# Patient Record
Sex: Male | Born: 2000 | Race: White | Hispanic: No | Marital: Single | Smoking: Never smoker
Health system: Southern US, Community
[De-identification: ages and names within clinical notes are randomized; demographics above are authoritative.]

## PROBLEM LIST (undated history)

## (undated) HISTORY — PX: OTHER SURGICAL HISTORY: SHX169

## (undated) HISTORY — PX: HERNIA REPAIR: SHX51

---
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
## 2022-03-08 ENCOUNTER — Inpatient Hospital Stay
Admission: RE | Admit: 2022-03-08 | Discharge: 2022-03-08 | Disposition: A | Discharge status: Home / Self Care | Payer: Managed Care, Other (non HMO) | Source: Ambulatory Visit

## 2022-03-08 ENCOUNTER — Ambulatory Visit
Admission: EM | Admit: 2022-03-08 | Discharge: 2022-03-08 | Disposition: A | Discharge status: Home / Self Care | Payer: Managed Care, Other (non HMO) | Attending: Emergency Medicine | Admitting: Emergency Medicine

## 2022-03-08 DIAGNOSIS — J302 Other seasonal allergic rhinitis: Secondary | ICD-10-CM | POA: Diagnosis not present

## 2022-03-08 DIAGNOSIS — R059 Cough, unspecified: Secondary | ICD-10-CM

## 2022-03-08 MED ORDER — FLUTICASONE PROPIONATE 50 MCG/ACT NA SUSP: 1.0000 | Freq: Every day | NASAL | 0 refills | Status: DC

## 2022-03-08 MED ORDER — CETIRIZINE HCL 10 MG PO TABS: 10.0000 mg | ORAL_TABLET | Freq: Every day | ORAL | 0 refills | Status: DC

## 2022-03-08 NOTE — ED Provider Notes (Signed)
William Barr    CSN: [id number] Arrival date & time: 03/08/22  1151      History   Chief Complaint Chief Complaint  Patient presents with   Cough    Neck and lung pain - Entered by patient    HPI William Barr is a 21 y.o. male.  Accompanied by his mother, patient presents with cough, congestion, runny nose x3 weeks.  No fever, chills, shortness of breath, vomiting, diarrhea, or other symptoms.  No OTC medications taken today; previously treated with DayQuil.  No pertinent medical history.  The history is provided by the patient and a parent.    History reviewed. No pertinent past medical history.  There are no problems to display for this patient.   Past Surgical History:  Procedure Laterality Date   HERNIA REPAIR     testicular torsion         Home Medications    Prior to Admission medications   Medication Sig Start Date End Date Taking? Authorizing Provider  cetirizine (ZYRTEC ALLERGY) 10 MG tablet Take 1 tablet (10 mg total) by mouth daily. 03/08/22  Yes Mickie Bail, NP  fluticasone (FLONASE) 50 MCG/ACT nasal spray Place 1 spray into both nostrils daily. 03/08/22  Yes Mickie Bail, NP  doxycycline (MONODOX) 100 MG capsule Take 1 capsule (100 mg total) by mouth as directed. 1 po bid for 1 week, then decrease to 1 po qd for 3 weeks, take with food and drink 08/05/21   Deirdre Evener, MD    Family History History reviewed. No pertinent family history.  Social History Social History   Tobacco Use   Smoking status: Never   Smokeless tobacco: Never  Substance Use Topics   Alcohol use: Yes   Drug use: Never     Allergies   Patient has no known allergies.   Review of Systems Review of Systems  Constitutional:  Negative for chills and fever.  HENT:  Positive for congestion, postnasal drip and rhinorrhea. Negative for ear pain and sore throat.   Respiratory:  Positive for cough. Negative for shortness of breath.   Cardiovascular:  Negative for  chest pain and palpitations.  Gastrointestinal:  Negative for diarrhea and vomiting.  Skin:  Negative for color change and rash.  All other systems reviewed and are negative.    Physical Exam Triage Vital Signs ED Triage Vitals [03/08/22 1154]  Enc Vitals Group     BP 115/71     Pulse Rate 62     Resp 18     Temp 98.4 F (36.9 C)     Temp src      SpO2 97 %     Weight      Height      Head Circumference      Peak Flow      Pain Score      Pain Loc      Pain Edu?      Excl. in GC?    No data found.  Updated Vital Signs BP 115/71   Pulse 62   Temp 98.4 F (36.9 C)   Resp 18   Ht 5\' 9"  (1.753 m)   Wt 145 lb (65.8 kg)   SpO2 97%   BMI 21.41 kg/m   Visual Acuity Right Eye Distance:   Left Eye Distance:   Bilateral Distance:    Right Eye Near:   Left Eye Near:    Bilateral Near:     Physical Exam  Vitals and nursing note reviewed.  Constitutional:      General: He is not in acute distress.    Appearance: Normal appearance. He is well-developed. He is not ill-appearing.  HENT:     Right Ear: Tympanic membrane normal.     Left Ear: Tympanic membrane normal.     Nose: Nose normal.     Mouth/Throat:     Mouth: Mucous membranes are moist.     Pharynx: Oropharynx is clear.     Comments: Clear PND. Cardiovascular:     Rate and Rhythm: Normal rate and regular rhythm.     Heart sounds: Normal heart sounds.  Pulmonary:     Effort: Pulmonary effort is normal. No respiratory distress.     Breath sounds: Normal breath sounds.  Musculoskeletal:     Cervical back: Neck supple.  Skin:    General: Skin is warm and dry.  Neurological:     Mental Status: He is alert.  Psychiatric:        Mood and Affect: Mood normal.        Behavior: Behavior normal.      UC Treatments / Results  Labs (all labs ordered are listed, but only abnormal results are displayed) Labs Reviewed - No data to display  EKG   Radiology No results found.  Procedures Procedures  (including critical care time)  Medications Ordered in UC Medications - No data to display  Initial Impression / Assessment and Plan / UC Course  I have reviewed the triage vital signs and the nursing notes.  Pertinent labs & imaging results that were available during my care of the patient were reviewed by me and considered in my medical decision making (see chart for details).   Seasonal allergies, cough.  Afebrile, vital signs are stable.  Treating with Zyrtec and Flonase.  Education provided on allergic rhinitis and cough.  Instructed patient to follow up with his PCP if his symptoms are not improving.  He agrees to plan of care.     Final Clinical Impressions(s) / UC Diagnoses   Final diagnoses:  Seasonal allergies  Cough, unspecified type     Discharge Instructions      Take the Zyrtec and use the Flonase nasal spray as directed.  Follow up with your primary care provider if your symptoms are not improving.        ED Prescriptions     Medication Sig Dispense Auth. Provider   cetirizine (ZYRTEC ALLERGY) 10 MG tablet Take 1 tablet (10 mg total) by mouth daily. 30 tablet Sharion Balloon, NP   fluticasone (FLONASE) 50 MCG/ACT nasal spray Place 1 spray into both nostrils daily. 16 g Sharion Balloon, NP      PDMP not reviewed this encounter.   Sharion Balloon, NP 03/08/22 1255

## 2022-03-08 NOTE — Discharge Instructions (Addendum)
Take the Zyrtec and use the Flonase nasal spray as directed.  Follow up with your primary care provider if your symptoms are not improving.

## 2022-03-08 NOTE — ED Triage Notes (Addendum)
Patient to Urgent Care with complaints of cough, productive at times with thick white mucus. Symptoms started 3 weeks ago. 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   Has been using a air filter in his dorm room with a hepa filter without improvement in symptoms.

## 2022-04-12 ENCOUNTER — Ambulatory Visit
Admission: EM | Admit: 2022-04-12 | Discharge: 2022-04-12 | Disposition: A | Discharge status: Home / Self Care | Payer: Managed Care, Other (non HMO)

## 2022-04-12 DIAGNOSIS — J029 Acute pharyngitis, unspecified: Secondary | ICD-10-CM | POA: Diagnosis not present

## 2022-04-12 LAB — POCT RAPID STREP A (OFFICE): Rapid Strep A Screen: NEGATIVE

## 2022-04-12 MED ORDER — AZITHROMYCIN 250 MG PO TABS: ORAL_TABLET | ORAL | 0 refills | Status: DC

## 2022-04-12 NOTE — Discharge Instructions (Signed)
I have prescribed an antibiotic for you to use if your symptoms worsen in the next few days.  Follow-up with your primary care provider or here if symptoms do not resolve or worsen even with treatment.

## 2022-04-12 NOTE — ED Provider Notes (Signed)
William Barr    CSN: [id number] Arrival date & time: 04/12/22  1807      History   Chief Complaint Chief Complaint  Patient presents with   Sore Throat    Entered by patient    HPI William Barr is a 21 y.o. male.    Sore Throat    Companied by his mom.  Presents to UC with complaint of sore throat and swollen lymph nodes starting this morning.  Pain is worse in the morning and improves after drinking cold liquids.  Pain recurs throughout the day.  Denies fever, myalgias, chills.  Endorses communal living in dorm at college.  Recent contact with family members with similar symptoms and diagnosed with strep.  History reviewed. No pertinent past medical history.  There are no problems to display for this patient.   Past Surgical History:  Procedure Laterality Date   HERNIA REPAIR     testicular torsion         Home Medications    Prior to Admission medications   Medication Sig Start Date End Date Taking? Authorizing Provider  EPINEPHrine 0.3 mg/0.3 mL IJ SOAJ injection Inject into the muscle. 11/09/21  Yes [provider]  cetirizine (ZYRTEC ALLERGY) 10 MG tablet Take 1 tablet (10 mg total) by mouth daily. 03/08/22   Mickie Bail, NP  doxycycline (MONODOX) 100 MG capsule Take 1 capsule (100 mg total) by mouth as directed. 1 po bid for 1 week, then decrease to 1 po qd for 3 weeks, take with food and drink 08/05/21   Deirdre Evener, MD  fluticasone [facility]) 50 MCG/ACT nasal spray Place 1 spray into both nostrils daily. 03/08/22   Mickie Bail, NP    Family History History reviewed. No pertinent family history.  Social History Social History   Tobacco Use   Smoking status: Never   Smokeless tobacco: Never  Substance Use Topics   Alcohol use: Yes   Drug use: Never     Allergies   Patient has no known allergies.   Review of Systems Review of Systems   Physical Exam Triage Vital Signs ED Triage Vitals  Enc Vitals Group     BP  04/12/22 1908 108/60     Pulse Rate 04/12/22 1908 (!) 106     Resp 04/12/22 1908 18     Temp 04/12/22 1908 98.7 F (37.1 C)     Temp Source 04/12/22 1908 Oral     SpO2 04/12/22 1908 97 %     Weight --      Height --      Head Circumference --      Peak Flow --      Pain Score 04/12/22 1916 5     Pain Loc --      Pain Edu? --      Excl. in GC? --    No data found.  Updated Vital Signs BP 108/60 (BP Location: Left Arm)   Pulse (!) 106   Temp 98.7 F (37.1 C) (Oral)   Resp 18   SpO2 97%   Visual Acuity Right Eye Distance:   Left Eye Distance:   Bilateral Distance:    Right Eye Near:   Left Eye Near:    Bilateral Near:     Physical Exam Vitals reviewed.  Constitutional:      Appearance: He is well-developed.  HENT:     Mouth/Throat:     Mouth: Mucous membranes are moist.  Pharynx: Posterior oropharyngeal erythema present. No oropharyngeal exudate.     Tonsils: No tonsillar exudate.  Skin:    General: Skin is warm and dry.  Neurological:     General: No focal deficit present.     Mental Status: He is alert and oriented to person, place, and time.  Psychiatric:        Mood and Affect: Mood normal.        Behavior: Behavior normal.      UC Treatments / Results  Labs (all labs ordered are listed, but only abnormal results are displayed) Labs Reviewed  POCT RAPID STREP A (OFFICE)    EKG   Radiology No results found.  Procedures Procedures (including critical care time)  Medications Ordered in UC Medications - No data to display  Initial Impression / Assessment and Plan / UC Course  I have reviewed the triage vital signs and the nursing notes.  Pertinent labs & imaging results that were available during my care of the patient were reviewed by me and considered in my medical decision making (see chart for details).   Rapid strep is negative.  Pharynx is erythematous without presence of peritonsillar exudates.  Patient is college student and no  access to transportation if symptoms worsen.  Mom requests prescription for antibiotic for patient to use if symptoms worsen or develops peritonsillar exudates.   Final Clinical Impressions(s) / UC Diagnoses   Final diagnoses:  None   Discharge Instructions   None    ED Prescriptions   None    PDMP not reviewed this encounter.   Rose Phi, [place] 04/12/22 1930

## 2022-04-12 NOTE — ED Triage Notes (Signed)
Pt. Presents to UC w/ c/o a sore throat and swollen lymph nodes that started this morning.

## 2022-04-15 ENCOUNTER — Ambulatory Visit
Admission: RE | Admit: 2022-04-15 | Discharge: 2022-04-15 | Disposition: A | Discharge status: Home / Self Care | Payer: Managed Care, Other (non HMO) | Source: Ambulatory Visit | Attending: Emergency Medicine | Admitting: Emergency Medicine

## 2022-04-15 VITALS — BP 101/69 | HR 82 | Temp 98.4°F | Resp 16

## 2022-04-15 DIAGNOSIS — J029 Acute pharyngitis, unspecified: Secondary | ICD-10-CM | POA: Insufficient documentation

## 2022-04-15 DIAGNOSIS — R6889 Other general symptoms and signs: Secondary | ICD-10-CM | POA: Diagnosis not present

## 2022-04-15 DIAGNOSIS — Z1152 Encounter for screening for COVID-19: Secondary | ICD-10-CM | POA: Insufficient documentation

## 2022-04-15 LAB — RESP PANEL BY RT-PCR (RSV, FLU A&B, COVID)  RVPGX2
Influenza A by PCR: NEGATIVE
Influenza B by PCR: NEGATIVE
Resp Syncytial Virus by PCR: NEGATIVE
SARS Coronavirus 2 by RT PCR: NEGATIVE

## 2022-04-15 LAB — POCT MONO SCREEN (KUC): Mono, POC: NEGATIVE

## 2022-04-15 MED ORDER — IBUPROFEN 600 MG PO TABS: 600.0000 mg | ORAL_TABLET | Freq: Four times a day (QID) | ORAL | 0 refills | Status: DC | PRN

## 2022-04-15 MED ORDER — AMOXICILLIN-POT CLAVULANATE 875-125 MG PO TABS: 1.0000 | ORAL_TABLET | Freq: Two times a day (BID) | ORAL | 0 refills | Status: AC

## 2022-04-15 NOTE — ED Triage Notes (Signed)
Pt. Present to UC w/ c/o a sore throat, bodyaches, fatigue and a headache that started 5 days ago. Pt. Was seen and treated her on 11/06 for similar symptoms that have gotten progressively worse.

## 2022-04-15 NOTE — ED Provider Notes (Signed)
HPI  SUBJECTIVE:  William Barr is a 21 y.o. male who presents with persistent sore throat, fevers Tmax 102, body aches, headaches, nasal congestion, rhinorrhea, postnasal drip, nausea, swollen cervical lymphadenopathy.  He is on day #5/5 of azithromycin, has been taking Advil, NyQuil, Mucinex.  The Advil helps with the headache.  No alleviating factors.  No aggravating factors.  No sinus pain or pressure, facial swelling, upper dental pain, cough, wheeze, shortness of breath, vomiting, diarrhea, abdominal pain.  No rash, drooling, trismus, sensation of throat swelling shut, neck stiffness, voice changes.   no known mono, COVID, flu exposure.  He got 2 doses of the COVID-vaccine.  He did not get this years flu vaccine.  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  No antipyretic in the past 6 hours.  He had a negative home COVID test last night.  He had mono 2 years ago.  All immunizations are up-to-date.  PCP: [facility]  Patient was seen here on 11/7, 3 days ago, with sore throat and swollen lymph nodes.  Rapid strep negative.  Patient was sent home with a wait-and-see prescription of azithromycin, which he states he started that day.  History reviewed. No pertinent past medical history.  Past Surgical History:  Procedure Laterality Date   HERNIA REPAIR     testicular torsion      History reviewed. No pertinent family history.  Social History   Tobacco Use   Smoking status: Never   Smokeless tobacco: Never  Substance Use Topics   Alcohol use: Yes   Drug use: Never    No current facility-administered medications for this encounter.  Current Outpatient Medications:    amoxicillin-clavulanate (AUGMENTIN) 875-125 MG tablet, Take 1 tablet by mouth every 12 (twelve) hours for 10 days., Disp: 20 tablet, Rfl: 0   ibuprofen (ADVIL) 600 MG tablet, Take 1 tablet (600 mg total) by mouth every 6 (six) hours as needed., Disp: 30 tablet, Rfl: 0    EPINEPHrine 0.3 mg/0.3 mL IJ SOAJ injection, Inject into the muscle., Disp: , Rfl:    fluticasone (FLONASE) 50 MCG/ACT nasal spray, Place 1 spray into both nostrils daily., Disp: 16 g, Rfl: 0  No Known Allergies   ROS  As noted in HPI.   Physical Exam  BP 101/69   Pulse 82   Temp 98.4 F (36.9 C)   Resp 16   SpO2 96%   Constitutional: Well developed, well nourished, no acute distress Eyes:  EOMI, conjunctiva normal bilaterally HENT: Normocephalic, atraumatic,mucus membranes moist.  Positive nasal congestion.  Erythematous, swollen turbinates.  No maxillary, frontal sinus tenderness.  Erythematous oropharynx, tonsils slightly enlarged, no exudates.  Uvula midline. Neck: Positive anterior, posterior lymph nodes. Respiratory: Normal inspiratory effort Cardiovascular: Normal rate regular rhythm, no murmurs GI: nondistended, nontender, no splenomegaly skin: No rash, skin intact Musculoskeletal: no deformities Neurologic: Alert & oriented x 3, no focal neuro deficits Psychiatric: Speech and behavior appropriate   ED Course   Medications - No data to display  Orders Placed This Encounter  Procedures   Resp panel by RT-PCR (RSV, Flu A&B, Covid) Anterior Nasal Swab    Standing Status:   Standing    Number of Occurrences:   1    Order Specific Question:   Patient immune status    Answer:   Normal    Order Specific Question:   Release to patient    Answer:   Immediate [1]   POCT mono screen  Standing Status:   Standing    Number of Occurrences:   1    Results for orders placed or performed during the hospital encounter of 04/15/22 (from the past 24 hour(s))  POCT mono screen     Status: None   Collection Time: 04/15/22  9:04 AM  Result Value Ref Range   Mono, POC Negative Negative   No results found.  ED Clinical Impression  1. Acute pharyngitis, unspecified etiology   2. Flu-like symptoms      ED Assessment/Plan     Previous records reviewed.  As noted in  HPI. Patient with an acute illness with systemic symptoms of fever. Patient with anterior/posterior lymphadenopathy, continued pharyngitis and fevers despite being on azithromycin day 5/5.  will check a mono, COVID and flu even though patient is out of the treatment window for both.  Doubt RPA, peritonsillar abscess, epiglottitis with a normal voice.  Monospot negative.  COVID, flu pending.   discussed with mother and with patient that unfortunately he is out of the treatment window for both.  Home with Flonase, Benadryl/Maalox mixture, Tylenol/ibuprofen.  We can try some Augmentin for pharyngitis.  ER return precautions given.  Discussed labs, MDM, treatment plan, and plan for follow-up with parent and patient. Discussed sn/sx that should prompt return to the ED. they agree with plan.   Meds ordered this encounter  Medications   amoxicillin-clavulanate (AUGMENTIN) 875-125 MG tablet    Sig: Take 1 tablet by mouth every 12 (twelve) hours for 10 days.    Dispense:  20 tablet    Refill:  0   ibuprofen (ADVIL) 600 MG tablet    Sig: Take 1 tablet (600 mg total) by mouth every 6 (six) hours as needed.    Dispense:  30 tablet    Refill:  0      *This clinic note was created using Scientist, clinical (histocompatibility and immunogenetics). Therefore, there may be occasional mistakes despite careful proofreading.  ?    Domenick Gong, MD 04/15/22 [phone]

## 2022-04-15 NOTE — Discharge Instructions (Signed)
Mono negative.  Actually, the treatment for pharyngitis is 10, rather than 7 days.  However, this will still cover sinusitis.  The antibiotics, even if you feel better.  Flonase, saline nasal irrigation with a Lloyd Huger Med rinse and distilled water as often as you want, take 600 mg of ibuprofen combined with 1000 mg of Tylenol 3 times a day as needed. Make sure you drink plenty of extra fluids.  Some people find salt water gargles and  Traditional Medicinal's "Throat Coat" tea helpful. Take 5 mL of liquid Benadryl and 5 mL of Maalox. Mix it together, and then hold it in your mouth for as long as you can and then swallow. You may do this 4 times a day.    Go to www.goodrx.com  or www.costplusdrugs.com to look up your medications. This will give you a list of where you can find your prescriptions at the most affordable prices. Or ask the pharmacist what the cash price is, or if they have any other discount programs available to help make your medication more affordable. This can be less expensive than what you would pay with insurance.

## 2022-08-16 ENCOUNTER — Telehealth: Payer: Self-pay

## 2022-08-16 NOTE — Telephone Encounter (Signed)
Copied from [place] [phone]. Topic: Appointment Scheduling - New Patient >> Aug 15, 2022  4:33 PM William Barr wrote: Reason for CRM: New Patient  New patient has not been scheduled for your office. Patients father is an established patient and wants his son to see Dr Garnette Gunner also.  Patient is on Spring Break, due to fathers HCM diagnosis he is concerned and would like to know if provider could see his son this week.

## 2022-08-16 NOTE — Telephone Encounter (Signed)
Apt 08/17/2022

## 2022-08-16 NOTE — Telephone Encounter (Signed)
Yeah that's fine, any 40 min opening will work

## 2022-08-17 ENCOUNTER — Encounter: Payer: Self-pay | Admitting: Internal Medicine

## 2022-08-17 ENCOUNTER — Ambulatory Visit: Payer: Managed Care, Other (non HMO) | Admitting: Internal Medicine

## 2022-08-17 VITALS — BP 96/84 | HR 70 | Temp 96.9°F | Ht 69.0 in | Wt 137.0 lb

## 2022-08-17 DIAGNOSIS — Z0001 Encounter for general adult medical examination with abnormal findings: Secondary | ICD-10-CM

## 2022-08-17 DIAGNOSIS — F988 Other specified behavioral and emotional disorders with onset usually occurring in childhood and adolescence: Secondary | ICD-10-CM | POA: Diagnosis not present

## 2022-08-17 DIAGNOSIS — Z113 Encounter for screening for infections with a predominantly sexual mode of transmission: Secondary | ICD-10-CM | POA: Diagnosis not present

## 2022-08-17 DIAGNOSIS — Z8249 Family history of ischemic heart disease and other diseases of the circulatory system: Secondary | ICD-10-CM

## 2022-08-17 NOTE — Patient Instructions (Signed)
Preventive Care 18-21 Years Old, Male ?Preventive care refers to lifestyle choices and visits with your health care provider that can promote health and wellness. At this stage in your life, you may start seeing a primary care physician instead of a pediatrician for your preventive care. Preventive care visits are also called wellness exams. ?What can I expect for my preventive care visit? ?Counseling ?During your preventive care visit, your health care provider may ask about your: ?Medical history, including: ?Past medical problems. ?Family medical history. ?Current health, including: ?Home life and relationship well-being. ?Emotional well-being. ?Sexual activity and sexual health. ?Lifestyle, including: ?Alcohol, nicotine or tobacco, and drug use. ?Access to firearms. ?Diet, exercise, and sleep habits. ?Sunscreen use. ?Motor vehicle safety. ?Physical exam ?Your health care provider may check your: ?Height and weight. These may be used to calculate your BMI (body mass index). BMI is a measurement that tells if you are at a healthy weight. ?Waist circumference. This measures the distance around your waistline. This measurement also tells if you are at a healthy weight and may help predict your risk of certain diseases, such as type 2 diabetes and high blood pressure. ?Heart rate and blood pressure. ?Body temperature. ?Skin for abnormal spots. ?What immunizations do I need? ? ?Vaccines are usually given at various ages, according to a schedule. Your health care provider will recommend vaccines for you based on your age, medical history, and lifestyle or other factors, such as travel or where you work. ?What tests do I need? ?Screening ?Your health care provider may recommend screening tests for certain conditions. This may include: ?Vision and hearing tests. ?Lipid and cholesterol levels. ?Hepatitis B test. ?Hepatitis C test. ?HIV (human immunodeficiency virus) test. ?STI (sexually transmitted infection) testing, if  you are at risk. ?Tuberculosis skin test. ?Talk with your health care provider about your test results, treatment options, and if necessary, the need for more tests. ?Follow these instructions at home: ?Eating and drinking ? ?Eat a healthy diet that includes fresh fruits and vegetables, whole grains, lean protein, and low-fat dairy products. ?Drink enough fluid to keep your urine pale yellow. ?Do not drink alcohol if: ?Your health care provider tells you not to drink. ?You are under the legal drinking age. In the [place] the legal drinking age is 21. ?If you drink alcohol: ?Limit how much you have to 0-2 drinks a day. ?Know how much alcohol is in your drink. In the [place] one drink equals one 12 oz bottle of beer (355 mL), one 5 oz glass of wine (148 mL), or one 1? oz glass of hard liquor (44 mL). ?Lifestyle ?Brush your teeth every morning and night with fluoride toothpaste. Floss one time each day. ?Exercise for at least 30 minutes 5 or more days of the week. ?Do not use any products that contain nicotine or tobacco. These products include cigarettes, chewing tobacco, and vaping devices, such as e-cigarettes. If you need help quitting, ask your health care provider. ?Do not use drugs. ?If you are sexually active, practice safe sex. Use a condom or other form of protection to prevent STIs. ?Find healthy ways to manage stress, such as: ?Meditation, yoga, or listening to music. ?Journaling. ?Talking to a trusted person. ?Spending time with friends and family. ?Safety ?Always wear your seat belt while driving or riding in a vehicle. ?Do not drive: ?If you have been drinking alcohol. Do not ride with someone who has been drinking. ?When you are tired or distracted. ?While texting. ?If you have been using   any mind-altering substances or drugs. ?Wear a helmet and other protective equipment during sports activities. ?If you have firearms in your house, make sure you follow all gun safety procedures. ?Seek help if you have  been bullied, physically abused, or sexually abused. ?Use the internet responsibly to avoid dangers, such as online bullying and online sex predators. ?What's next? ?Go to your health care provider once a year for an annual wellness visit. ?Ask your health care provider how often you should have your eyes and teeth checked. ?Stay up to date on all vaccines. ?This information is not intended to replace advice given to you by your health care provider. Make sure you discuss any questions you have with your health care provider. ?Document Revised: 11/18/2020 Document Reviewed: 11/18/2020 ?Elsevier Patient Education ? 2023 Elsevier Inc. ? ?

## 2022-08-17 NOTE — Assessment & Plan Note (Signed)
Okay to continue Vyvanse for now

## 2022-08-17 NOTE — Progress Notes (Signed)
HPI  Patient presents to clinic today to establish care and for management of the conditions listed below.  ADD: Managed with Vyvanse.  He follows with [place] attention specialist.  Flu: as needed Tetanus: UTD COVID: x 2 Dentist: biannually  Diet: He does eat lean meat. He rarely consumes fruits and veggies. He does eat fried foods. He drinks mostly soda, water Exercise: workout 2 x week, weights  No past medical history on file.  Current Outpatient Medications  Medication Sig Dispense Refill   EPINEPHrine 0.3 mg/0.3 mL IJ SOAJ injection Inject into the muscle.     fluticasone (FLONASE) 50 MCG/ACT nasal spray Place 1 spray into both nostrils daily. 16 g 0   ibuprofen (ADVIL) 600 MG tablet Take 1 tablet (600 mg total) by mouth every 6 (six) hours as needed. 30 tablet 0   No current facility-administered medications for this visit.    No Known Allergies  No family history on file.  Social History   Socioeconomic History   Marital status: Single    Spouse name: Not on file   Number of children: Not on file   Years of education: Not on file   Highest education level: Not on file  Occupational History   Not on file  Tobacco Use   Smoking status: Never   Smokeless tobacco: Never  Substance and Sexual Activity   Alcohol use: Yes   Drug use: Never   Sexual activity: Not on file  Other Topics Concern   Not on file  Social History Narrative   Not on file   Social Determinants of Health   Financial Resource Strain: Not on file  Food Insecurity: Not on file  Transportation Needs: Not on file  Physical Activity: Not on file  Stress: Not on file  Social Connections: Not on file  Intimate Partner Violence: Not on file    ROS:  Constitutional: Denies fever, malaise, fatigue, headache or abrupt weight changes.  HEENT: Denies eye pain, eye redness, ear pain, ringing in the ears, wax buildup, runny nose, nasal congestion, bloody nose, or sore throat. Respiratory:  Denies difficulty breathing, shortness of breath, cough or sputum production.   Cardiovascular: Denies chest pain, chest tightness, palpitations or swelling in the hands or feet.  Gastrointestinal: Denies abdominal pain, bloating, constipation, diarrhea or blood in the stool.  GU: Denies frequency, urgency, pain with urination, blood in urine, odor or discharge. Musculoskeletal: Denies decrease in range of motion, difficulty with gait, muscle pain or joint pain and swelling.  Skin: Denies redness, rashes, lesions or ulcercations.  Neurological: Denies dizziness, difficulty with memory, difficulty with speech or problems with balance and coordination.  Psych: Denies anxiety, depression, SI/HI.  No other specific complaints in a complete review of systems (except as listed in HPI above).  PE: BP 96/84 (BP Location: Right Arm, Patient Position: Sitting, Cuff Size: Normal)   Pulse 70   Temp (!) 96.9 F (36.1 C) (Temporal)   Ht '5\' 9"'$  (1.753 m)   Wt 137 lb (62.1 kg)   SpO2 98%   BMI 20.23 kg/m   Wt Readings from Last 3 Encounters:  03/08/22 145 lb (65.8 kg) (38 %, Z= -0.30)*   * Growth percentiles are based on CDC (Boys, 2-20 Years) data.    General: Appears his stated age, well developed, well nourished in NAD. HEENT: Head: normal shape and size; Eyes: sclera white, no icterus, conjunctiva pink, PERRLA and EOMs intact;  Neck: Neck supple, trachea midline. No masses, lumps or thyromegaly  present.  Cardiovascular: Normal rate and rhythm. S1,S2 noted.  No murmur, rubs or gallops noted. No JVD or BLE edema.  Pulmonary/Chest: Normal effort and positive vesicular breath sounds. No respiratory distress. No wheezes, rales or ronchi noted.  Abdomen: Soft and nontender. Normal bowel sounds. Musculoskeletal: Strength 5/5 BUE/BLE. No difficulty with gait.  Neurological: Alert and oriented. Cranial nerves II-XII grossly intact. Coordination normal.  Psychiatric: Mood and affect normal. Behavior is  normal. Judgment and thought content normal.     Assessment and Plan:  Preventative Health Maintenance, Screen for STD:  Encouraged him to get a flu shot in the fall Tetanus UTD Encouraged him to get his COVID vaccine Encouraged him to consume a balanced diet and exercise regimen Advised him to see dentist annually Will check CBC, c-Met, lipid, HIV, RPR, hep C, gonorrhea, chlamydia and trichomonas  Family History of Hypertrophic Cardiomyopathy:  Indication for ECG: Family history of hypertrophic cardiomyopathy Interpretation of ECG: Normal rate and rhythm Comparison of ECG: None They are requesting genetic testing for familial cardiomyopathy  RTC in 1 year for your annual exam Webb Silversmith, NP

## 2022-08-18 LAB — FAMILIAL CARDIOMYOPATHY PANEL

## 2022-08-21 LAB — FAMILIAL CARDIOMYOPATHY PANEL

## 2022-10-05 LAB — LIPID PANEL
Chol/HDL Ratio: 2.3 ratio (ref 0.0–5.0)
Cholesterol, Total: 120 mg/dL (ref 100–169)
HDL: 52 mg/dL (ref >39)
LDL Chol Calc (NIH): 56 mg/dL (ref 0–109)
Triglycerides: 50 mg/dL (ref 0–89)
VLDL Cholesterol Cal: 12 mg/dL (ref 5–40)

## 2022-10-05 LAB — COMPREHENSIVE METABOLIC PANEL
ALT: 16 IU/L (ref 0–44)
AST: 21 IU/L (ref 0–40)
Albumin/Globulin Ratio: 2.1 (ref 1.2–2.2)
Albumin: 4.9 g/dL (ref 4.3–5.2)
Alkaline Phosphatase: 73 IU/L (ref 51–125)
BUN/Creatinine Ratio: 13 (ref 9–20)
BUN: 13 mg/dL (ref 6–20)
Bilirubin Total: 0.4 mg/dL (ref 0.0–1.2)
CO2: 23 mmol/L (ref 20–29)
Calcium: 9.9 mg/dL (ref 8.7–10.2)
Chloride: 101 mmol/L (ref 96–106)
Creatinine, Ser: 0.98 mg/dL (ref 0.76–1.27)
Globulin, Total: 2.3 g/dL (ref 1.5–4.5)
Glucose: 77 mg/dL (ref 70–99)
Potassium: 4.9 mmol/L (ref 3.5–5.2)
Sodium: 138 mmol/L (ref 134–144)
Total Protein: 7.2 g/dL (ref 6.0–8.5)
eGFR: 114 mL/min/{1.73_m2} (ref >59)

## 2022-10-05 LAB — CBC
Hematocrit: 46 % (ref 37.5–51.0)
Hemoglobin: 16 g/dL (ref 13.0–17.7)
MCH: 29.9 pg (ref 26.6–33.0)
MCHC: 34.8 g/dL (ref 31.5–35.7)
MCV: 86 fL (ref 79–97)
Platelets: 251 10*3/uL (ref 150–450)
RBC: 5.36 x10E6/uL (ref 4.14–5.80)
RDW: 13 % (ref 11.6–15.4)
WBC: 5.9 10*3/uL (ref 3.4–10.8)

## 2022-10-05 LAB — STI PROFILE, CT/NG/TV
Chlamydia by NAA: NEGATIVE
Gonococcus by NAA: NEGATIVE
HCV Ab: NONREACTIVE
HIV Screen 4th Generation wRfx: NONREACTIVE
Hep B Core Total Ab: NEGATIVE
Hep B Surface Ab, Qual: NONREACTIVE
Hepatitis B Surface Ag: NEGATIVE
RPR Ser Ql: NONREACTIVE
Trich vag by NAA: NEGATIVE

## 2022-10-05 LAB — HEPATITIS C ANTIBODY: Hep C Virus Ab: NONREACTIVE

## 2022-10-05 LAB — FAMILIAL CARDIOMYOPATHY PANEL

## 2022-10-05 LAB — FAMILIAL CARDIOMYOPATHY 67GENE

## 2022-10-05 LAB — HCV INTERPRETATION

## 2022-10-18 ENCOUNTER — Ambulatory Visit (INDEPENDENT_AMBULATORY_CARE_PROVIDER_SITE_OTHER): Payer: Managed Care, Other (non HMO) | Admitting: Internal Medicine

## 2022-10-18 ENCOUNTER — Encounter: Payer: Self-pay | Admitting: Internal Medicine

## 2022-10-18 VITALS — BP 122/68 | HR 67 | Temp 96.7°F | Wt 132.0 lb

## 2022-10-18 DIAGNOSIS — R800 Isolated proteinuria: Secondary | ICD-10-CM

## 2022-10-18 DIAGNOSIS — F5089 Other specified eating disorder: Secondary | ICD-10-CM

## 2022-10-18 DIAGNOSIS — R824 Acetonuria: Secondary | ICD-10-CM

## 2022-10-18 DIAGNOSIS — R799 Abnormal finding of blood chemistry, unspecified: Secondary | ICD-10-CM

## 2022-10-18 DIAGNOSIS — F509 Eating disorder, unspecified: Secondary | ICD-10-CM | POA: Insufficient documentation

## 2022-10-18 LAB — POCT URINALYSIS DIPSTICK
Bilirubin, UA: NEGATIVE
Blood, UA: NEGATIVE
Glucose, UA: NEGATIVE
Leukocytes, UA: NEGATIVE
Nitrite, UA: NEGATIVE
Protein, UA: POSITIVE — AB
Spec Grav, UA: 1.015 (ref 1.010–1.025)
Urobilinogen, UA: 0.2 E.U./dL
pH, UA: 7.5 (ref 5.0–8.0)

## 2022-10-18 NOTE — Patient Instructions (Signed)
Eating Disorders An eating disorder is a medical and mental health condition. Over time, eating disorders damage the body and have an impact on mood and mental health. Eating disorders can be life-threatening. The most common eating disorders are: Bulimia nervosa. This type involves eating large amounts of food in a short period of time (binge). This is often followed by getting rid of the calories that were eaten (purging) by vomiting, exercising too much, or taking laxative medicines. Bulimia may start as a way to control weight. Later, it may be triggered by stress or an emotional crisis. Anorexia nervosa. This type involves having extremely low body weight from severe dieting, too much exercising, or both. Losing weight or preventing weight gain becomes an obsession. Anorexia is often used as a way to cope with emotional problems. Binge eating disorder (BED). This type involves eating a large amount of food in 2 hours or less and feeling out of control overeating. People who have BED eat too quickly, feel very full, eat when they are not hungry, and usually eat alone. Typically, a binge happens three or more times a week. Other specified feeding or eating disorder. This is when a person has some symptoms of BED, bulimia nervosa, or anorexia nervosa, but not enough symptoms to diagnose a specific disorder. Problems caused by eating disorders Eating disorders can lead to serious medical problems. These may include: Not having enough food or nutrients (malnutrition). Hormone imbalance. Not having enough vitamins and minerals in the body. Damage to organs. Being very underweight or overweight. Damage to the teeth, jaw, and esophagus. What causes eating disorders? The cause of an eating disorder is not known. However, an eating disorder may be influenced by: Having a family history of eating disorders. Experiencing trauma. Frequently dieting. Having a lot of stress. Having other mental health  issues. Focusing on messages from society about beauty and body image. What are the symptoms of an eating disorder? Symptoms of an eating disorder include being obsessed with food, eating, body weight, and appearance. People with eating disorders may also have other medical problems due to eating behavior or the behaviors used to lose weight. Symptoms of anorexia include: Severely restricting food intake. Purging calories through exercise, vomiting, or the use of medicines. Having a distorted view of one's weight or size. Symptoms of bulimia include: Binge eating. People who binge eat consume an extreme amount of food in 2 hours or less. Following a binge, bulimia involves getting rid of the food by vomiting, exercising, or using medicines. Feeling a lot of shame about eating and purging behavior. Being very concerned with weight. Symptoms of BED include: Binge eating, which involves eating large amounts of food in 2 hours or less. Frequently eating more than intended and feeling out of control. Feeling disgusted and embarrassed about one's eating habits. How are eating disorders treated? Treatment for an eating disorder may include: Therapy or counseling sessions with specialists in eating disorders. Seeing a diet and nutrition expert (dietitian). Getting the right amount of exercise. Taking medicines for anxiety or depression. Staying in the hospital or going to an eating disorders program. Follow these instructions at home: Lifestyle  Learn about eating disorders. Know what situations trigger your symptoms. Make a plan to help you deal with these situations. Resist weighing yourself or checking yourself in the mirror often. Find programs or resources for people with eating disorders. Talk with an eating disorder specialist, therapist, or counselor about your eating behavior. General instructions Follow instructions from your health   care provider about eating and exercising. Return  to your normal activities as told by your health care provider. Ask your health care provider what activities are safe for you. Get regular dental care every 6 months. Keep all follow-up visits. This is important. Take herbal remedies or over-the-counter and prescription medicines only as told by your health care provider. Where to find support National Eating Disorders: www.nationaleatingdisorders.org National Eating Disorders Helpline: 1-800-931-2237 National Institute of Mental Health: www.nimh.nih.gov/eating-disorders Summary Eating disorders are medical and mental health conditions. Eating disorders can be life-threatening. Treatment may include therapy or medicines. Talk with an eating disorder specialist, therapist, or counselor about your eating behavior. This information is not intended to replace advice given to you by your health care provider. Make sure you discuss any questions you have with your health care provider. Document Revised: 10/17/2020 Document Reviewed: 10/17/2020 Elsevier Patient Education  2023 Elsevier Inc.  

## 2022-10-18 NOTE — Progress Notes (Signed)
Subjective:    Patient ID: William Barr, male    DOB: [birth year], 22 y.o.   MRN: [id number]  HPI  Patient presents to clinic today with complaint of abnormal labs.  He reports he had labs drawn at school in April because of poor appetite.  He reports his BUN was elevated and he had protein and ketones in his urine.  He reports that food is not appealing to him.  He reports that he does drink boost protein drinks but does not drink a lot of water.  He is seeing [facility] and reports they recently started him on Fluoxetine for possible eating disorder.  Review of Systems     No past medical history on file.  Current Outpatient Medications  Medication Sig Dispense Refill   EPINEPHrine 0.3 mg/0.3 mL IJ SOAJ injection Inject into the muscle.     VYVANSE 30 MG capsule Take 30 mg by mouth daily.     No current facility-administered medications for this visit.    Allergies  Allergen Reactions   Alpha-Gal     Family History  Problem Relation Age of Onset   Colon polyps Mother    Throat cancer Father    Skin cancer Father    Melanoma Maternal Grandfather    Bladder Cancer Maternal Grandfather    Congestive Heart Failure Paternal Grandmother    Atrial fibrillation Paternal Grandmother    Arrhythmia Paternal Grandfather     Social History   Socioeconomic History   Marital status: Single    Spouse name: Not on file   Number of children: Not on file   Years of education: Not on file   Highest education level: Not on file  Occupational History   Not on file  Tobacco Use   Smoking status: Never   Smokeless tobacco: Never  Substance and Sexual Activity   Alcohol use: Yes   Drug use: Never   Sexual activity: Not on file  Other Topics Concern   Not on file  Social History Narrative   Not on file   Social Determinants of Health   Financial Resource Strain: Not on file  Food Insecurity: Not on file  Transportation Needs: Not on file  Physical  Activity: Not on file  Stress: Not on file  Social Connections: Not on file  Intimate Partner Violence: Not on file     Constitutional: Patient reports unintentional weight loss.  Denies fever, malaise, fatigue, headache.  HEENT: Denies eye pain, eye redness, ear pain, ringing in the ears, wax buildup, runny nose, nasal congestion, bloody nose, or sore throat. Respiratory: Denies difficulty breathing, shortness of breath, cough or sputum production.   Cardiovascular: Denies chest pain, chest tightness, palpitations or swelling in the hands or feet.  Gastrointestinal: Patient reports poor appetite.  Denies abdominal pain, bloating, constipation, diarrhea or blood in the stool.  GU: Denies urgency, frequency, pain with urination, burning sensation, blood in urine, odor or discharge. Musculoskeletal: Denies decrease in range of motion, difficulty with gait, muscle pain or joint pain and swelling.  Skin: Denies redness, rashes, lesions or ulcercations.  Neurological: Patient reports inattention.  Denies dizziness, difficulty with memory, difficulty with speech or problems with balance and coordination.  Psych: Denies anxiety, depression, SI/HI.  No other specific complaints in a complete review of systems (except as listed in HPI above).  Objective:   Physical Exam BP 122/68 (BP Location: Left Arm, Patient Position: Sitting, Cuff Size: Normal)   Pulse 67   Temp Marland Kitchen)  96.7 F (35.9 C) (Temporal)   Wt 132 lb (59.9 kg)   SpO2 99%   BMI 19.49 kg/m   Wt Readings from Last 3 Encounters:  08/17/22 137 lb (62.1 kg) (22 %, Z= -0.76)*  03/08/22 145 lb (65.8 kg) (38 %, Z= -0.30)*   * Growth percentiles are based on CDC (Boys, 2-20 Years) data.    General: Appears his stated age, well developed, well nourished in NAD. Skin: Warm, dry and intact.  Cardiovascular: Normal rate and rhythm. S1,S2 noted.  No murmur, rubs or gallops noted.  Pulmonary/Chest: Normal effort and positive vesicular breath  sounds. No respiratory distress. No wheezes, rales or ronchi noted.  Musculoskeletal:  No difficulty with gait.  Neurological: Alert and oriented. Coordination normal.  Psychiatric: Mood and affect normal. Behavior is normal. Judgment and thought content normal.    BMET    Component Value Date/Time   NA 138 08/17/2022 1101   K 4.9 08/17/2022 1101   CL 101 08/17/2022 1101   CO2 23 08/17/2022 1101   GLUCOSE 77 08/17/2022 1101   BUN 13 08/17/2022 1101   CREATININE 0.98 08/17/2022 1101   CALCIUM 9.9 08/17/2022 1101    Lipid Panel     Component Value Date/Time   CHOL 120 08/17/2022 1101   TRIG 50 08/17/2022 1101   HDL 52 08/17/2022 1101   CHOLHDL 2.3 08/17/2022 1101   LDLCALC 56 08/17/2022 1101    CBC    Component Value Date/Time   WBC 5.9 08/17/2022 1101   RBC 5.36 08/17/2022 1101   HGB 16.0 08/17/2022 1101   HCT 46.0 08/17/2022 1101   PLT 251 08/17/2022 1101   MCV 86 08/17/2022 1101   MCH 29.9 08/17/2022 1101   MCHC 34.8 08/17/2022 1101   RDW 13.0 08/17/2022 1101    Hgb A1C No results found for: "HGBA1C"         Assessment & Plan:   Elevated BUN, Proteinuria, Ketonuria, due to Eating Disorder:  Will repeat renal function panel Urinalysis shows positive protein and trace ketones again Advised him that these labs indicated that he is not drinking enough water and he likely does not have an underlying kidney problem Advised him that Fluoxetine may not be the best drug for him given that it can cause weight loss. Advised him to discuss this with his psychiatrist and to discuss Mirtazapine 7.5 mg for insomnia and appetite stimulation  RTC in 10 months for your annual exam Nicki Reaper, NP

## 2022-10-19 ENCOUNTER — Encounter: Payer: Self-pay | Admitting: Internal Medicine

## 2022-10-19 DIAGNOSIS — R824 Acetonuria: Secondary | ICD-10-CM

## 2022-10-19 DIAGNOSIS — R799 Abnormal finding of blood chemistry, unspecified: Secondary | ICD-10-CM

## 2022-10-19 DIAGNOSIS — R800 Isolated proteinuria: Secondary | ICD-10-CM

## 2022-10-19 LAB — RENAL FUNCTION PANEL
Albumin: 4.7 g/dL (ref 3.6–5.1)
BUN/Creatinine Ratio: 28 (calc) — ABNORMAL HIGH (ref 6–22)
BUN: 23 mg/dL — ABNORMAL HIGH (ref 7–20)
CO2: 31 mmol/L (ref 20–32)
Calcium: 10 mg/dL (ref 8.9–10.4)
Chloride: 101 mmol/L (ref 98–110)
Creat: 0.82 mg/dL (ref 0.60–1.24)
Glucose, Bld: 49 mg/dL — ABNORMAL LOW (ref 65–99)
Phosphorus: 5.1 mg/dL — ABNORMAL HIGH (ref 2.7–5.0)
Potassium: 4.7 mmol/L (ref 3.8–5.1)
Sodium: 141 mmol/L (ref 135–146)

## 2022-10-27 ENCOUNTER — Other Ambulatory Visit: Payer: Self-pay | Admitting: Nephrology

## 2022-10-27 DIAGNOSIS — R809 Proteinuria, unspecified: Secondary | ICD-10-CM

## 2022-10-27 DIAGNOSIS — R944 Abnormal results of kidney function studies: Secondary | ICD-10-CM

## 2022-11-03 ENCOUNTER — Ambulatory Visit: Admission: RE | Admit: 2022-11-03 | Payer: Managed Care, Other (non HMO) | Source: Ambulatory Visit

## 2024-03-22 ENCOUNTER — Ambulatory Visit (INDEPENDENT_AMBULATORY_CARE_PROVIDER_SITE_OTHER): Admitting: Internal Medicine

## 2024-03-22 ENCOUNTER — Encounter: Payer: Self-pay | Admitting: Internal Medicine

## 2024-03-22 VITALS — BP 104/68 | Ht 69.0 in | Wt 131.6 lb

## 2024-03-22 DIAGNOSIS — Z136 Encounter for screening for cardiovascular disorders: Secondary | ICD-10-CM | POA: Diagnosis not present

## 2024-03-22 DIAGNOSIS — Z23 Encounter for immunization: Secondary | ICD-10-CM | POA: Diagnosis not present

## 2024-03-22 DIAGNOSIS — R739 Hyperglycemia, unspecified: Secondary | ICD-10-CM

## 2024-03-22 DIAGNOSIS — Z0000 Encounter for general adult medical examination without abnormal findings: Secondary | ICD-10-CM

## 2024-03-22 NOTE — Patient Instructions (Signed)
 Health Maintenance, Male  Adopting a healthy lifestyle and getting preventive care are important in promoting health and wellness. Ask your health care provider about:  The right schedule for you to have regular tests and exams.  Things you can do on your own to prevent diseases and keep yourself healthy.  What should I know about diet, weight, and exercise?  Eat a healthy diet    Eat a diet that includes plenty of vegetables, fruits, low-fat dairy products, and lean protein.  Do not eat a lot of foods that are high in solid fats, added sugars, or sodium.  Maintain a healthy weight  Body mass index (BMI) is a measurement that can be used to identify possible weight problems. It estimates body fat based on height and weight. Your health care provider can help determine your BMI and help you achieve or maintain a healthy weight.  Get regular exercise  Get regular exercise. This is one of the most important things you can do for your health. Most adults should:  Exercise for at least 150 minutes each week. The exercise should increase your heart rate and make you sweat (moderate-intensity exercise).  Do strengthening exercises at least twice a week. This is in addition to the moderate-intensity exercise.  Spend less time sitting. Even light physical activity can be beneficial.  Watch cholesterol and blood lipids  Have your blood tested for lipids and cholesterol at 21 years of age, then have this test every 5 years.  You may need to have your cholesterol levels checked more often if:  Your lipid or cholesterol levels are high.  You are older than 21 years of age.  You are at high risk for heart disease.  What should I know about cancer screening?  Many types of cancers can be detected early and may often be prevented. Depending on your health history and family history, you may need to have cancer screening at various ages. This may include screening for:  Colorectal cancer.  Prostate cancer.  Skin cancer.  Lung  cancer.  What should I know about heart disease, diabetes, and high blood pressure?  Blood pressure and heart disease  High blood pressure causes heart disease and increases the risk of stroke. This is more likely to develop in people who have high blood pressure readings or are overweight.  Talk with your health care provider about your target blood pressure readings.  Have your blood pressure checked:  Every 3-5 years if you are 21-52 years of age.  Every year if you are 21 years old or older.  If you are between the ages of 60 and 72 and are a current or former smoker, ask your health care provider if you should have a one-time screening for abdominal aortic aneurysm (AAA).  Diabetes  Have regular diabetes screenings. This checks your fasting blood sugar level. Have the screening done:  Once every three years after age 66 if you are at a normal weight and have a low risk for diabetes.  More often and at a younger age if you are overweight or have a high risk for diabetes.  What should I know about preventing infection?  Hepatitis B  If you have a higher risk for hepatitis B, you should be screened for this virus. Talk with your health care provider to find out if you are at risk for hepatitis B infection.  Hepatitis C  Blood testing is recommended for:  Everyone born from [birth year] through 1963.  Anyone  with known risk factors for hepatitis C.  Sexually transmitted infections (STIs)  You should be screened each year for STIs, including gonorrhea and chlamydia, if:  You are sexually active and are younger than 21 years of age.  You are older than 21 years of age and your health care provider tells you that you are at risk for this type of infection.  Your sexual activity has changed since you were last screened, and you are at increased risk for chlamydia or gonorrhea. Ask your health care provider if you are at risk.  Ask your health care provider about whether you are at high risk for HIV. Your health care provider  may recommend a prescription medicine to help prevent HIV infection. If you choose to take medicine to prevent HIV, you should first get tested for HIV. You should then be tested every 3 months for as long as you are taking the medicine.  Follow these instructions at home:  Alcohol use  Do not drink alcohol if your health care provider tells you not to drink.  If you drink alcohol:  Limit how much you have to 0-2 drinks a day.  Know how much alcohol is in your drink. In the [place] one drink equals one 12 oz bottle of beer (355 mL), one 5 oz glass of wine (148 mL), or one 1 oz glass of hard liquor (44 mL).  Lifestyle  Do not use any products that contain nicotine or tobacco. These products include cigarettes, chewing tobacco, and vaping devices, such as e-cigarettes. If you need help quitting, ask your health care provider.  Do not use street drugs.  Do not share needles.  Ask your health care provider for help if you need support or information about quitting drugs.  General instructions  Schedule regular health, dental, and eye exams.  Stay current with your vaccines.  Tell your health care provider if:  You often feel depressed.  You have ever been abused or do not feel safe at home.  Summary  Adopting a healthy lifestyle and getting preventive care are important in promoting health and wellness.  Follow your health care provider's instructions about healthy diet, exercising, and getting tested or screened for diseases.  Follow your health care provider's instructions on monitoring your cholesterol and blood pressure.  This information is not intended to replace advice given to you by your health care provider. Make sure you discuss any questions you have with your health care provider.  Document Revised: 10/12/2020 Document Reviewed: 10/12/2020  Elsevier Patient Education  2024 [employer].

## 2024-03-22 NOTE — Progress Notes (Signed)
 Subjective:    Subjective Patient ID: William Barr, male    DOB: Oct 31, 2000, 23 y.o.   MRN: [id number]  HPI  Patient presents to clinic today for his annual exam  ADD: He reports mainly inattention.  He is no longer taking vyvanse.  [facility]  Flu: as needed Tetanus: UTD COVID: x 2 Dentist: biannually  Diet: He does eat lean meat. He rarely consumes fruits and veggies. He does eat fried foods. He drinks mostly soda, water Exercise: workout 2 x week, weights  No past medical history on file.  Current Outpatient Medications  Medication Sig Dispense Refill   EPINEPHrine 0.3 mg/0.3 mL IJ SOAJ injection Inject into the muscle.     FLUoxetine (PROZAC) 10 MG capsule Take 10 mg by mouth daily.     VYVANSE 40 MG capsule Take 40 mg by mouth daily. (Patient not taking: Reported on 10/18/2022)     No current facility-administered medications for this visit.    Allergies  Allergen Reactions   Alpha-Gal     Family History  Problem Relation Age of Onset   Colon polyps Mother    Throat cancer Father    Skin cancer Father    Melanoma Maternal Grandfather    Bladder Cancer Maternal Grandfather    Congestive Heart Failure Paternal Grandmother    Atrial fibrillation Paternal Grandmother    Arrhythmia Paternal Grandfather     Social History   Socioeconomic History   Marital status: Single    Spouse name: Not on file   Number of children: Not on file   Years of education: Not on file   Highest education level: Not on file  Occupational History   Not on file  Tobacco Use   Smoking status: Never   Smokeless tobacco: Never  Substance and Sexual Activity   Alcohol use: Yes   Drug use: Never   Sexual activity: Not on file  Other Topics Concern   Not on file  Social History Narrative   Not on file   Social Drivers of Health   Financial Resource Strain: Not on file  Food Insecurity: Not on file  Transportation Needs: Not on file   Physical Activity: Not on file  Stress: Not on file  Social Connections: Not on file  Intimate Partner Violence: Not on file    ROS:  Constitutional: Denies fever, malaise, fatigue, headache or abrupt weight changes.  HEENT: Denies eye pain, eye redness, ear pain, ringing in the ears, wax buildup, runny nose, nasal congestion, bloody nose, or sore throat. Respiratory: Denies difficulty breathing, shortness of breath, cough or sputum production.   Cardiovascular: Denies chest pain, chest tightness, palpitations or swelling in the hands or feet.  Gastrointestinal: Denies abdominal pain, bloating, constipation, diarrhea or blood in the stool.  GU: Denies frequency, urgency, pain with urination, blood in urine, odor or discharge. Musculoskeletal: Denies decrease in range of motion, difficulty with gait, muscle pain or joint pain and swelling.  Skin: Denies redness, rashes, lesions or ulcercations.  Neurological: Patient reports inattention.  Denies dizziness, difficulty with memory, difficulty with speech or problems with balance and coordination.  Psych: Denies anxiety, depression, SI/HI.  No other specific complaints in a complete review of systems (except as listed in HPI above).  BP 104/68 (BP Location: Left Arm, Patient Position: Sitting, Cuff Size: Normal)   Ht 5' 9 (1.753 m)   Wt 131 lb 9.6 oz (59.7 kg)   BMI 19.43 kg/m  Wt Readings from Last 3 Encounters:  10/18/22 132 lb (59.9 kg) (15%, Z= -1.05)*  08/17/22 137 lb (62.1 kg) (22%, Z= -0.76)*  03/08/22 145 lb (65.8 kg) (38%, Z= -0.30)*   * Growth percentiles are based on CDC (Boys, 2-20 Years) data.    General: Appears his stated age, well developed, well nourished in NAD. HEENT: Head: normal shape and size; Eyes: sclera white, no icterus, conjunctiva pink, PERRLA and EOMs intact;  Neck: Neck supple, trachea midline. No masses, lumps or thyromegaly present.  Cardiovascular: Normal rate and rhythm. S1,S2 noted.  No  murmur, rubs or gallops noted. No JVD or BLE edema.  Pulmonary/Chest: Normal effort and positive vesicular breath sounds. No respiratory distress. No wheezes, rales or ronchi noted.  Abdomen: Soft and nontender. Normal bowel sounds. Musculoskeletal: Strength 5/5 BUE/BLE. No difficulty with gait.  Neurological: Alert and oriented. Cranial nerves II-XII grossly intact. Coordination normal.  Psychiatric: Mood and affect normal. Behavior is normal. Judgment and thought content normal.    CBC    Component Value Date/Time   WBC 5.9 08/17/2022 1101   RBC 5.36 08/17/2022 1101   HGB 16.0 08/17/2022 1101   HCT 46.0 08/17/2022 1101   PLT 251 08/17/2022 1101   MCV 86 08/17/2022 1101   MCH 29.9 08/17/2022 1101   MCHC 34.8 08/17/2022 1101   RDW 13.0 08/17/2022 1101      Latest Ref Rng & Units 10/18/2022    9:29 AM 08/17/2022   11:01 AM  CMP  Glucose 65 - 99 mg/dL 49  77   BUN 7 - 20 mg/dL 23  13   Creatinine 9.39 - 1.24 mg/dL 9.17  9.01   Sodium 864 - 146 mmol/L 141  138   Potassium 3.8 - 5.1 mmol/L 4.7  4.9   Chloride 98 - 110 mmol/L 101  101   CO2 20 - 32 mmol/L 31  23   Calcium 8.9 - 10.4 mg/dL 89.9  9.9   Total Protein 6.0 - 8.5 g/dL  7.2   Total Bilirubin 0.0 - 1.2 mg/dL  0.4   Alkaline Phos 51 - 125 IU/L  73   AST 0 - 40 IU/L  21   ALT 0 - 44 IU/L  16    Lipid Panel     Component Value Date/Time   CHOL 120 08/17/2022 1101   TRIG 50 08/17/2022 1101   HDL 52 08/17/2022 1101   CHOLHDL 2.3 08/17/2022 1101   LDLCALC 56 08/17/2022 1101   LABVLDL 12 08/17/2022 1101    Assessment and Plan:  Preventative Health Maintenance:  Flu shot today Tetanus today Encouraged him to get his COVID vaccine Encouraged him to consume a balanced diet and exercise regimen Advised him to see dentist annually Will check CBC, c-Met, lipid, and A1c today    RTC in 1 year for your annual exam Angeline Laura, NP

## 2024-04-13 LAB — BASIC METABOLIC PANEL WITH GFR
BUN/Creatinine Ratio: 13 (ref 9–20)
BUN: 12 mg/dL (ref 6–20)
CO2: 24 mmol/L (ref 20–29)
Calcium: 9.6 mg/dL (ref 8.7–10.2)
Chloride: 102 mmol/L (ref 96–106)
Creatinine, Ser: 0.93 mg/dL (ref 0.76–1.27)
Glucose: 56 mg/dL — ABNORMAL LOW (ref 70–99)
Potassium: 4.3 mmol/L (ref 3.5–5.2)
Sodium: 142 mmol/L (ref 134–144)
eGFR: 120 mL/min/1.73 (ref >59)

## 2024-04-13 LAB — CBC
Hematocrit: 46.6 % (ref 37.5–51.0)
Hemoglobin: 15.8 g/dL (ref 13.0–17.7)
MCH: 31 pg (ref 26.6–33.0)
MCHC: 33.9 g/dL (ref 31.5–35.7)
MCV: 91 fL (ref 79–97)
Platelets: 222 x10E3/uL (ref 150–450)
RBC: 5.1 x10E6/uL (ref 4.14–5.80)
RDW: 12.9 % (ref 11.6–15.4)
WBC: 6 x10E3/uL (ref 3.4–10.8)

## 2024-04-13 LAB — LIPID PANEL
Chol/HDL Ratio: 2.4 ratio (ref 0.0–5.0)
Cholesterol, Total: 125 mg/dL (ref 100–199)
HDL: 52 mg/dL (ref >39)
LDL Chol Calc (NIH): 60 mg/dL (ref 0–99)
Triglycerides: 58 mg/dL (ref 0–149)
VLDL Cholesterol Cal: 13 mg/dL (ref 5–40)

## 2024-04-13 LAB — HEMOGLOBIN A1C
Est. average glucose Bld gHb Est-mCnc: 103 mg/dL
Hgb A1c MFr Bld: 5.2 % (ref 4.8–5.6)

## 2024-04-15 ENCOUNTER — Ambulatory Visit: Payer: Self-pay | Admitting: Internal Medicine

## 2024-05-10 ENCOUNTER — Ambulatory Visit: Admitting: Internal Medicine

## 2024-05-24 ENCOUNTER — Ambulatory Visit: Attending: Internal Medicine | Admitting: Internal Medicine

## 2024-05-24 VITALS — BP 105/62 | HR 57 | Ht 69.0 in | Wt 124.2 lb

## 2024-05-24 DIAGNOSIS — R001 Bradycardia, unspecified: Secondary | ICD-10-CM | POA: Diagnosis not present

## 2024-05-24 DIAGNOSIS — I517 Cardiomegaly: Secondary | ICD-10-CM | POA: Insufficient documentation

## 2024-05-24 DIAGNOSIS — Z8249 Family history of ischemic heart disease and other diseases of the circulatory system: Secondary | ICD-10-CM | POA: Insufficient documentation

## 2024-05-24 NOTE — Patient Instructions (Signed)
 Medication Instructions:  Your physician recommends that you continue on your current medications as directed. Please refer to the Current Medication list given to you today.  *If you need a refill on your cardiac medications before your next appointment, please call your pharmacy*  Lab Work: NONE  If you have labs (blood work) drawn today and your tests are completely normal, you will receive your results only by: MyChart Message (if you have MyChart) OR A paper copy in the mail If you have any lab test that is abnormal or we need to change your treatment, we will call you to review the results.  Testing/Procedures: Prior to Jan 8 or May 2026- - - Your physician has requested that you have an echocardiogram. Echocardiography is a painless test that uses sound waves to create images of your heart. It provides your doctor with information about the size and shape of your heart and how well your hearts chambers and valves are working. This procedure takes approximately one hour. There are no restrictions for this procedure. Please do NOT wear cologne, perfume, aftershave, or lotions (deodorant is allowed). Please arrive 15 minutes prior to your appointment time.  Please note: We ask at that you not bring children with you during ultrasound (echo/ vascular) testing. Due to room size and safety concerns, children are not allowed in the ultrasound rooms during exams. Our front office staff cannot provide observation of children in our lobby area while testing is being conducted. An adult accompanying a patient to their appointment will only be allowed in the ultrasound room at the discretion of the ultrasound technician under special circumstances. We apologize for any inconvenience.   Follow-Up: At [facility], you and your health needs are our priority.  As part of our continuing mission to provide you with exceptional heart care, our providers are all part of one team.  This team  includes your primary Cardiologist (physician) and Advanced Practice Providers or APPs (Physician Assistants and Nurse Practitioners) who all work together to provide you with the care you need, when you need it.  Your next appointment:   1.5 year(s)  Provider:   Stanly DELENA Leavens, MD

## 2024-05-24 NOTE — Progress Notes (Signed)
 " Cardiology Office Note:  .    Date:  05/24/2024  ID:  William Barr, DOB [birth year], MRN [id number] PCP: William Barr ORN, NP  [facility] Providers Cardiologist:  William DELENA Leavens, MD     CC: Genetic testing review Consulted for the evaluation of MYBPC3 gene mutation at the behest of [name] (Ms. William)   History of Present Illness: William Barr is a 23 y.o. male with a history of Alpha-Gal, ADHD, and FHX of HCM.  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  William Barr is a 23 yo M  with a MYBC3 gene mutation who presents for evaluation of hypertrophic cardiomyopathy.  He is currently asymptomatic but has a family history of hypertrophic cardiomyopathy, as his father has sarcomeric hypertrophic cardiomyopathy. Genetic testing confirmed the presence of the MYBC3 gene mutation.  He has a history of shortness of breath during exercise, particularly when he played lacrosse in high school, where he felt 'very winded' and 'like I'm dying.' Currently, he does not engage in much physical activity. No current symptoms of chest pain, syncope, or presyncope.  His current medications include metoprolol succinate, Jardiance, and atorvastatin. He mentions a previous prescription for metformin, which he believes was not renewed, and he is unsure if he needs it.  He eats one to two meals a day and has a lifestyle that involves staying up most of the night due to his college schedule. 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 He is covered under his father's insurance as he is under 53 years old.  Discussed the use of AI scribe software for clinical note transcription with the patient, who gave verbal consent to proceed.   Relevant histories: .  Social  - I see his Dad, William Barr, with an MYBC3 gene mutation ROS: As per HPI.    Physical Exam:    VS:  BP 105/62 (BP  Location: Right Arm)   Pulse (!) 57   Ht 5' 9 (1.753 m)   Wt 124 lb 3.2 oz (56.3 kg)   SpO2 98%   BMI 18.34 kg/m    Wt Readings from Last 3 Encounters:  05/24/24 124 lb 3.2 oz (56.3 kg)  03/22/24 131 lb 9.6 oz (59.7 kg)  10/18/22 132 lb (59.9 kg) (15%, Z= -1.05)*   * Growth percentiles are based on CDC (Boys, 2-20 Years) data.    Gen: No distress Cardiac: No Rubs or Gallops, no Murmur, RRR +2 radial pulses Respiratory: Clear to auscultation bilaterally, normal effort, normal  respiratory rate GI: Soft, nontender, non-distended  MS: No edema;  moves all extremities Integument: Skin feels warm Neuro:  At time of evaluation, alert and oriented to person/place/time/situation  Psych: Normal affect, patient feels ok      ASSESSMENT AND PLAN: .    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 Currently asymptomatic with no evidence of phenotypic expression. Discussed potential for future development of hypertrophic cardiomyopathy and implications for family planning, including IVF with embryo selection to avoid passing the gene mutation. Emphasized that having the gene does not guarantee development of the disease. Discussed potential treatments if symptoms develop, including medications and gene therapy. Explained that sudden cardiac death is a concern  but current risk is low (does not presently have diagnosis of HCM). Discussed that patients with hypertrophic cardiomyopathy have the same life expectancy as those without the condition. - Ordered echocardiogram in May to assess for hypertrophic cardiomyopathy (Or before Jan 8th- he is at school at [employer]). - Scheduled follow-up appointment in June 2026 unless symptoms develop. - Discussed potential for IVF with  embryo selection if considering family planning (this is likely a long ways away). - Provided information on potential treatments if symptoms develop.  Left ventricular hypertrophy on EKG Bradycardia EKG shows signs consistent with left ventricular hypertrophy, which could be due to juvenile T wave changes or early hypertrophic cardiomyopathy. No current symptoms suggestive of obstructive hypertrophic cardiomyopathy. Discussed the importance of monitoring for symptoms such as chest pain, shortness of breath, or syncope. Explained that if hypertrophic cardiomyopathy is confirmed, further testing and treatment options will be considered. - Ordered echocardiogram to evaluate for left ventricular hypertrophy. - Monitor for symptoms such as chest pain, shortness of breath, or syncope. - If symptoms develop, will consider further testing and treatment options.  Time:   I have spent a total of 41 minutes with the patient reviewing notes, imaging, EKGs, labs, and examining the patient as well as establishing an assessment and plan that was discussed personally with the patient. Discussed disease state education and reviewed his genetic testing. Reviewed care and plan in collaboration with father.  His mother created a list of questions to review and we have answered these as well.   William Leavens, MD FASE [facility] Cardiologist [facility]  [address] [place], [address] [place], [place] [address] [phone]  1:02 PM  "

## 2024-10-29 ENCOUNTER — Ambulatory Visit (HOSPITAL_COMMUNITY)

## 2025-03-27 ENCOUNTER — Encounter: Admitting: Internal Medicine
